# Patient Record
Sex: Male | Born: 1995 | Race: White | Hispanic: No | Marital: Single | Smoking: Never smoker
Health system: Southern US, Community
[De-identification: ages and names within clinical notes are randomized; demographics above are authoritative.]

## PROBLEM LIST (undated history)

## (undated) HISTORY — PX: SHOULDER SURGERY: SHX246

## (undated) HISTORY — PX: SHOULDER ARTHROSCOPY W/ ROTATOR CUFF REPAIR: SHX2400

---
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
## 2021-03-04 ENCOUNTER — Emergency Department: Payer: No Typology Code available for payment source

## 2021-03-04 ENCOUNTER — Emergency Department
Admission: EM | Admit: 2021-03-04 | Discharge: 2021-03-04 | Disposition: A | Discharge status: Home / Self Care | Payer: No Typology Code available for payment source | Attending: Emergency Medical Services | Admitting: Emergency Medical Services

## 2021-03-04 DIAGNOSIS — S62616A Displaced fracture of proximal phalanx of right little finger, initial encounter for closed fracture: Secondary | ICD-10-CM | POA: Insufficient documentation

## 2021-03-04 DIAGNOSIS — M24444 Recurrent dislocation, right finger: Secondary | ICD-10-CM | POA: Insufficient documentation

## 2021-03-04 DIAGNOSIS — S63259A Unspecified dislocation of unspecified finger, initial encounter: Secondary | ICD-10-CM

## 2021-03-04 DIAGNOSIS — W208XXA Other cause of strike by thrown, projected or falling object, initial encounter: Secondary | ICD-10-CM | POA: Insufficient documentation

## 2021-03-04 DIAGNOSIS — Y9389 Activity, other specified: Secondary | ICD-10-CM | POA: Insufficient documentation

## 2021-03-04 DIAGNOSIS — S62606A Fracture of unspecified phalanx of right little finger, initial encounter for closed fracture: Secondary | ICD-10-CM

## 2021-03-04 DIAGNOSIS — Y990 Civilian activity done for income or pay: Secondary | ICD-10-CM | POA: Insufficient documentation

## 2021-03-04 NOTE — Discharge Instructions (Signed)
Dear Nicholas Cline,    You were seen today by Deborah Chalk, PA-C. Thank you for choosing the [facility] Emergency Department for your healthcare needs.  We hope your visit today was EXCELLENT.    Rest and ice your injury.  Take over the counter medication as needed for pain.  Keep splint in place.  Follow up with Dr. [name] (ortho/hand).  Return to the emergency department for any new or worsening symptoms.    If you have any questions or concerns, I am available at [phone]. Please do not hesitate to contact me if I can be of assistance.     Below is some information and resources that our patients often find helpful.    Sincerely,    Deborah Chalk, Physician Assistant  [facility] - Department of Emergency Medicine    ________________________________________________________________      Thank you for choosing [facility] for your emergency care needs.  We strive to provide EXCELLENT care to you and your family.      DOCTOR REFERRALS  Call [phone] if you need any further referrals and we can help you find a primary care doctor or specialist.  Also, available online at:  [email or web address]    YOUR CONTACT INFORMATION  Before leaving please check with registration to make sure we have an up-to-date contact number.  You can call registration at [phone] to update your information.  For questions about your hospital bill, please call [phone].  For questions about your Emergency Dept Physician bill please call [phone].      FREE HEALTH SERVICES  If you need help with health or social services, please call 2-1-1 for a free referral to resources in your area.  2-1-1 is a free service connecting people with information on health insurance, free clinics, pregnancy, mental health, dental care, food assistance, housing, and substance abuse counseling.  Also, available online at:  http://[email or web address].org    MEDICAL RECORDS AND  TESTS  Certain laboratory test results do not come back the same day, for example urine cultures.  We will contact you if other important findings are noted.  Radiology films are often reviewed again to ensure accuracy.  If there is any discrepancy, we will notify you.      Please call [phone] to pick up a complimentary CD of any radiology studies performed.  If you or your doctor would like to request a copy of your medical records, please call [phone].      ORTHOPEDIC INJURY   Please know that significant injuries can exist even when an initial x-ray is read as normal or negative.  This can occur because some fractures (broken bones) are not initially visible on x-rays.  For this reason, close outpatient follow-up with your primary care doctor or bone specialist (orthopedist) is required.    MEDICATIONS AND FOLLOWUP  Please be aware that some prescription medications can cause drowsiness.  Use caution when driving or operating machinery.    The examination and treatment you have received in our Emergency Department is provided on an emergency basis, and is not intended to be a substitute for your primary care physician.  It is important that your doctor checks you again and that you report any new or remaining problems at that time.      LOCAL PHARMACIES  [facility] - [address], [place], [place] [address] (1.4 miles, 7 minutes)  [facility] - [address], [place], [place] [address] (  6.5 miles, 13 minutes)  Handout with directions available on request    PATIENT RELATIONS  If you have any concerns, issues, or feedback related to your care, positive or negative, please do not hesitate to contact Patient Relations at [phone]. They are open from 8:30AM-5:00PM Monday through Friday.

## 2021-03-04 NOTE — ED Provider Notes (Signed)
[facility]       [facility]  EMERGENCY DEPARTMENT HISTORY AND PHYSICAL EXAM    Patient Information     Patient Name: Nicholas Cline, Nicholas Cline  Encounter Date:  03/04/2021  Patient DOB:  [birth year]  MRN:  [id number]  Room:  34/SS 34  Rendering Provider: Delice Bison A. Jeannetta Nap, PA-C, CAQ-EM    History of Presenting Illness     Chief Complaint: finger injury  Historian: pt  Onset: sudden, 130pm  Quality: ***  Location: right 5th   Duration: x1.5 hrs      HPI Comments:   25 y.o. male otherwise healthy c/o sudden onset of right 5th finger injury s/p injury around 130pm. Pt states he was building a shelf and it began to fall and caught his finger as it fell bending the finger. No true crush injury. No paresthesias. *** Pt right hand dominant. Of note, pt states he's fractured and dislocated this finger before.      PMD: Nelma Rothman, MD    Past Medical History     History reviewed. No pertinent past medical history.    Past Surgical History     History reviewed. No pertinent surgical history.    Family History     History reviewed. No pertinent family history.    Social History     Social History     Socioeconomic History    Marital status: Single     Spouse name: Not on file    Number of children: Not on file    Years of education: Not on file    Highest education level: Not on file   Occupational History    Not on file   Tobacco Use    Smoking status: Never    Smokeless tobacco: Never   Vaping Use    Vaping Use: Every day   Substance and Sexual Activity    Alcohol use: No    Drug use: No    Sexual activity: Not on file   Other Topics Concern    Not on file   Social History Narrative    Not on file     Social Determinants of Health     Financial Resource Strain: Not on file   Food Insecurity: Not on file   Transportation Needs: Not on file   Physical Activity: Not on file   Stress: Not on file   Social Connections: Not on file   Intimate Partner Violence: Not on file   Housing Stability: Not  on file       Allergies     No Known Allergies    Home Medications     Prior to Admission medications    Not on File         Review of Systems         Physical Exam     Patient Vitals for the past 24 hrs:   BP Temp Temp src Pulse Resp SpO2 Weight   03/04/21 1441 123/84 98.1 F (36.7 C) Oral 73 18 98 % --   03/04/21 1438 -- -- -- -- -- -- 109.5 kg       ***    Orders Placed During This Encounter     Orders Placed This Encounter   Procedures    Finger Right Minimum 2 Vw       ED Medications Administered     ED Medication Orders (From admission, onward)      None  Diagnostic Study Results and Data Review     The results of the diagnostic studies below were reviewed by the ED provider:    Labs  Results       ** No results found for the last 24 hours. **            Radiologic Studies  Radiology Results (24 Hour)       ** No results found for the last 24 hours. **              Abnormal results/incidental findings discussed with pt and/or family: ***    Monitors, EKG, Procedures, Critical Care, and Splints     Cardiac Monitor Interpretation:  ***  EKG: ***    Splint check: ***     MDM and Clinical Notes     Working Differential (not completely inclusive): ***, etc    Nursing records reviewed and agree: Yes ***    Clinical Notes:              Re-Eval: Re-eval at     Phone Conversation: n/a    Personal Protective Equipment:  I was wearing appropriate personal protective equipment at the time of patient evaluation and for all face-to-face interactions:     Pandemic Caveat:  The patient was seen and evaluated during the time of COVID pandemic.  Significant limitations can be present in the evaluation and management of emergency department patients during pandemic conditions, including but not limited to lack of testing, rapidly changing IHS protocols, and/or limited follow-up resources.         Prescriptions       New Prescriptions    No medications on file       Diagnosis and Disposition     Clinical  Impression:  No diagnosis found.  Final diagnoses:   None       Disposition:  ED Disposition       None                  Rendering Provider: Cyndy Freeze. Jeannetta Nap, PA-C, CAQ-EM    Attending's signature signifies review of the provider note and clinical impression.      This note was generated by the [facility] EMR system/Dragon speech recognition and may contain inherent errors or omissions not intended by the user. Grammatical errors, random word insertions, deletions, pronoun errors and incomplete sentences are occasional consequences of this technology due to software limitations. Not all errors are caught or corrected. If there are questions or concerns about the content of this note or information contained within the body of this dictation they should be addressed directly with the author for clarification.

## 2021-03-04 NOTE — ED Triage Notes (Signed)
pt at work and shelf fell onto right fifth finger, concern for dislocation.

## 2024-07-13 ENCOUNTER — Encounter (INDEPENDENT_AMBULATORY_CARE_PROVIDER_SITE_OTHER): Payer: Self-pay

## 2024-07-13 ENCOUNTER — Ambulatory Visit (INDEPENDENT_AMBULATORY_CARE_PROVIDER_SITE_OTHER)

## 2024-07-13 VITALS — BP 110/74 | HR 68 | Temp 97.6°F | Resp 16 | Ht 72.5 in | Wt 238.8 lb

## 2024-07-13 DIAGNOSIS — Z131 Encounter for screening for diabetes mellitus: Secondary | ICD-10-CM

## 2024-07-13 DIAGNOSIS — Z13220 Encounter for screening for lipoid disorders: Secondary | ICD-10-CM

## 2024-07-13 DIAGNOSIS — Z7689 Persons encountering health services in other specified circumstances: Secondary | ICD-10-CM

## 2024-07-13 DIAGNOSIS — Z1159 Encounter for screening for other viral diseases: Secondary | ICD-10-CM

## 2024-07-13 DIAGNOSIS — Z0000 Encounter for general adult medical examination without abnormal findings: Secondary | ICD-10-CM

## 2024-07-13 DIAGNOSIS — Z113 Encounter for screening for infections with a predominantly sexual mode of transmission: Secondary | ICD-10-CM

## 2024-07-13 DIAGNOSIS — E049 Nontoxic goiter, unspecified: Secondary | ICD-10-CM

## 2024-07-13 NOTE — Progress Notes (Signed)
 [facility]                       Date of Exam: 07/13/2024 9:53 AM        Patient ID: Nicholas Cline is a 28 y.o. male.  Physician: Mardeen Feeling, MD        Chief Complaint:    Chief Complaint   Patient presents with    Annual Exam     NP-pt is fasting   Declines vaccine today          Assessment & Plan     Assessment & Plan  Well adult exam  Encounter to establish care  Encounter for HCV screening test for low risk patient  Screening for hyperlipidemia  Screening for diabetes mellitus  Screening for STD (sexually transmitted disease)  Counseled on eating 5 servings of vegetables and fruits daily. Exercise with moderate exertion for 30 minutes, 5 days per week. Continue to do safe practices like wearing seatbelts and helmets, wearing sunscreen and bug repellant, using condoms consistently, and seeing your dentist and eye doctor regularly.    CARDIAC SCREENING:  Lipid panel and HbA1c (diabetes screen) ordered today.    CANCER SCREENING:  Colon cancer - Colon cancer screening not indicated at this time.  Lung cancer - Non-smoker, screening not indicated.  Prostate cancer -Not Indicated.  Make sure you are wearing sunscreen regularly when outdoors to minimize your risk of exposure to UV radiation which can increase your risk of skin cancer     INFECTIOUS DISEASE:  Immunizations - Reviewed and up to date except flu and covid, declined  STD screening ordered  One time Hepatitis C screening ordered.  Orders:    Hemoglobin A1C; Future    Hepatitis C Antibody, Total; Future    Lipid Panel; Future    Chlamydia & Gonorrhoeae NAA; Future    HIV-1/2, Antigen and Antibody with Reflex to Confirmation; Future    RPR with Reflex to Titer; Future    Enlarged thyroid  Noted to have mildly enlarged thyroid on exam without any symptoms. No fhx of thyroid problems. Will check TSH  Orders:    Thyroid Stimulating Hormone (TSH) with Reflex to Free T4; Future           Follow-up:    Follow up in 1 year for annual or sooner if new concerns come up.           HPI:   Pt presents for annual physical.     Visit Type: Health Maintenance Visit    Work Status: working full-time - [employer]: regular dental visits twice a year  Vision: contact lenses and regular eye exams   Hearing: normal hearing  Sleep: 5-7 hours nightly and Trouble falling asleep    CARDIAC SCREENING:  ROS: Denies CP, DOE, orthopnea  Body mass index is 31.94 kg/m.   Exercise: gym   Diet - balanced  EtOH -   Alcohol Use: Not At Risk (07/13/2024)    AUDIT-C     Frequency of Alcohol Consumption: 2-4 times a month     Average Number of Drinks: 1 or 2     Frequency of Binge Drinking: Less than monthly     Smoking -   Tobacco Use: Medium Risk (07/13/2024)    Patient History     Smoking Tobacco Use: Former     Smokeless Tobacco Use: Never     Passive  Exposure: Not on file     No prior labs done  Notable family history of early heart disease: none    CANCER SCREENING:  ROS: Denies headaches, night sweats, fevers  Colon cancer (45-75): Not indicated.  Lung cancer screening (low dose CT scan annually for age 1-80 with 20 pack-years, current smoker or quit within 15 years): Not indicated.  Prostate cancer screening (55-69): Not Indicated . No fam hx of prostate cancer    AAA screening (65-75 who ever smoked): Not indicated     INFECTIOUS DISEASE:  Vaccines/immunizations: Influenza vaccination due and COVID booster due Last Tdap 2020. Has received COVID vaccine.  Recent Illness/Hospitalizations: None  HCV screening: due  No results found for: HCVAB  Yes sexually active, some concern for STD   Safe at home: yes  Normal urinary and bowel function.    DEPRESSION:   Little interest or pleasure in doing things: 0 (07/13/2024  9:52 AM)  Feeling down, depressed, or hopeless: 0 (07/13/2024  9:52 AM)  PHQ Total Score: 0 (07/13/2024  9:52 AM)        Problems discussed outside of wellness:     History of Present  Illness           Problem List:    Problem List[1]          Current Meds:    Medications Taking[2]       Allergies:    Allergies[3]          Past Surgical History:    Past Surgical History[4]        Family History:    Family History[5]        Social History:    Social History[6]        The following sections were reviewed this encounter by the provider: Med   Hx  Surg Hx  Fam Hx             Vital Signs:    BP 110/74 (BP Site: Left arm, Patient Position: Sitting, Cuff Size: Large)   Pulse 68   Temp 97.6 F (36.4 C) (Temporal)   Resp 16   Ht 1.842 m (6' 0.5)   Wt 108.3 kg (238 lb 12.8 oz)   SpO2 99%   BMI 31.94 kg/m          ROS:    All other systems are reviewed and negative        Physical Exam:    Physical Exam  Constitutional:       General: He is not in acute distress.     Appearance: Normal appearance.   HENT:      Head: Normocephalic and atraumatic.      Right Ear: External ear normal.      Left Ear: External ear normal.      Nose: Nose normal.      Mouth/Throat:      Mouth: Mucous membranes are moist.      Pharynx: Oropharynx is clear. No oropharyngeal exudate or posterior oropharyngeal erythema.   Eyes:      Extraocular Movements: Extraocular movements intact.      Conjunctiva/sclera: Conjunctivae normal.   Neck:      Comments: Palpated enlarged thyroid  Cardiovascular:      Rate and Rhythm: Normal rate and regular rhythm.      Pulses: Normal pulses.      Heart sounds: No murmur heard.     No gallop.   Pulmonary:      Effort: Pulmonary  effort is normal.      Breath sounds: No wheezing, rhonchi or rales.   Abdominal:      General: Abdomen is flat. Bowel sounds are normal.      Palpations: Abdomen is soft.      Tenderness: There is no abdominal tenderness. There is no guarding or rebound.   Musculoskeletal:         General: Normal range of motion.      Cervical back: Normal range of motion and neck supple. No tenderness.      Right lower leg: No edema.      Left lower leg: No edema.    Lymphadenopathy:      Cervical: No cervical adenopathy.   Skin:     General: Skin is warm.      Capillary Refill: Capillary refill takes less than 2 seconds.   Neurological:      General: No focal deficit present.      Mental Status: He is alert. Mental status is at baseline.      Motor: No weakness.      Gait: Gait normal.   Psychiatric:         Mood and Affect: Mood normal.                 Mardeen Feeling, M.D.  [facility] [place]  July 13, 2024                    [1] There is no problem list on file for this patient.   [2]   No outpatient medications have been marked as taking for the 07/13/24 encounter (Office Visit) with Feeling Mardeen, MD.   [3] No Known Allergies  [4]   Past Surgical History:  Procedure Laterality Date    SHOULDER SURGERY Right     around 5-6 x   [5] No family history on file.  [6]   Social History  Tobacco Use    Smoking status: Former     Average packs/day: 0.1 packs/day for 1 year (0.1 ttl pk-yrs)     Types: Cigarettes     Start date: 2018    Smokeless tobacco: Never   Vaping Use    Vaping status: Every Day   Substance Use Topics    Alcohol use: Yes     Alcohol/week: 1.0 - 2.0 standard drink of alcohol     Types: 1 - 2 Standard drinks or equivalent per week    Drug use: No

## 2024-07-16 LAB — LIPID PANEL
Cholesterol / HDL Ratio: 2.9 ratio (ref 0.0–5.0)
Cholesterol: 146 mg/dL (ref 100–199)
HDL: 51 mg/dL (ref >39)
LDL Chol Calculated (NIH): 80 mg/dL (ref 0–99)
Triglycerides: 75 mg/dL (ref 0–149)
VLDL Calculated: 15 mg/dL (ref 5–40)

## 2024-07-16 LAB — HEMOGLOBIN A1C: Hemoglobin A1C: 5.1 % (ref 4.8–5.6)

## 2024-07-16 LAB — THYROID STIMULATING HORMONE (TSH) WITH REFLEX TO FREE T4: TSH: 0.88 u[IU]/mL (ref 0.450–4.500)

## 2024-07-16 LAB — INTERPRETATION:

## 2024-07-16 LAB — CHLAMYDIA GONORRHOEAE NAA
CHLAMYDIA TRACHOMATIS, NAA: NEGATIVE
Neisseria gonorrhoeae, NAA: NEGATIVE

## 2024-07-16 LAB — RPR: RPR: NONREACTIVE

## 2024-07-16 LAB — HIV-1/2, ANTIGEN AND ANTIBODY WITH REFLEX TO CONFIRMATION: HIV Screen 4th Generation wRfx: NONREACTIVE

## 2024-07-16 LAB — HEPATITIS C ANTIBODY, TOTAL: HCV AB: NONREACTIVE

## 2024-07-18 ENCOUNTER — Ambulatory Visit (INDEPENDENT_AMBULATORY_CARE_PROVIDER_SITE_OTHER): Payer: Self-pay

## 2024-07-19 ENCOUNTER — Encounter (INDEPENDENT_AMBULATORY_CARE_PROVIDER_SITE_OTHER): Payer: Self-pay
